# Patient Record
Sex: Male | Born: 1965 | Race: Black or African American | Hispanic: No | Marital: Married | Smoking: Never smoker
Health system: Southern US, Community
[De-identification: ages and names within clinical notes are randomized; demographics above are authoritative.]

---

## 2018-02-24 ENCOUNTER — Emergency Department (HOSPITAL_BASED_OUTPATIENT_CLINIC_OR_DEPARTMENT_OTHER): Payer: BLUE CROSS/BLUE SHIELD

## 2018-02-24 ENCOUNTER — Emergency Department (HOSPITAL_BASED_OUTPATIENT_CLINIC_OR_DEPARTMENT_OTHER)
Admission: EM | Admit: 2018-02-24 | Discharge: 2018-02-25 | Disposition: A | Discharge status: Home / Self Care | Payer: BLUE CROSS/BLUE SHIELD | Attending: Emergency Medicine | Admitting: Emergency Medicine

## 2018-02-24 ENCOUNTER — Other Ambulatory Visit: Payer: Self-pay

## 2018-02-24 ENCOUNTER — Encounter (HOSPITAL_BASED_OUTPATIENT_CLINIC_OR_DEPARTMENT_OTHER): Payer: Self-pay | Admitting: Emergency Medicine

## 2018-02-24 DIAGNOSIS — R1010 Upper abdominal pain, unspecified: Secondary | ICD-10-CM | POA: Diagnosis present

## 2018-02-24 DIAGNOSIS — R1013 Epigastric pain: Secondary | ICD-10-CM | POA: Diagnosis not present

## 2018-02-24 DIAGNOSIS — R6510 Systemic inflammatory response syndrome (SIRS) of non-infectious origin without acute organ dysfunction: Secondary | ICD-10-CM

## 2018-02-24 LAB — URINALYSIS, ROUTINE W REFLEX MICROSCOPIC
Bilirubin Urine: NEGATIVE
GLUCOSE, UA: NEGATIVE mg/dL
KETONES UR: NEGATIVE mg/dL
LEUKOCYTES UA: NEGATIVE
NITRITE: NEGATIVE
PROTEIN: NEGATIVE mg/dL
Specific Gravity, Urine: >1.03 — ABNORMAL HIGH (ref 1.005–1.030)
pH: 5.5 (ref 5.0–8.0)

## 2018-02-24 LAB — I-STAT CG4 LACTIC ACID, ED
LACTIC ACID, VENOUS: 1.7 mmol/L (ref 0.5–1.9)
Lactic Acid, Venous: 2.25 mmol/L (ref 0.5–1.9)

## 2018-02-24 LAB — COMPREHENSIVE METABOLIC PANEL
ALK PHOS: 47 U/L (ref 38–126)
ALT: 49 U/L — ABNORMAL HIGH (ref 0–44)
ANION GAP: 10 (ref 5–15)
AST: 37 U/L (ref 15–41)
Albumin: 3.8 g/dL (ref 3.5–5.0)
BUN: 15 mg/dL (ref 6–20)
CALCIUM: 8.3 mg/dL — AB (ref 8.9–10.3)
CO2: 23 mmol/L (ref 22–32)
CREATININE: 1.46 mg/dL — AB (ref 0.61–1.24)
Chloride: 107 mmol/L (ref 98–111)
GFR calc Af Amer: >60 mL/min (ref >60)
GFR, EST NON AFRICAN AMERICAN: 53 mL/min — AB (ref >60)
Glucose, Bld: 133 mg/dL — ABNORMAL HIGH (ref 70–99)
Potassium: 3.3 mmol/L — ABNORMAL LOW (ref 3.5–5.1)
Sodium: 140 mmol/L (ref 135–145)
Total Bilirubin: 0.8 mg/dL (ref 0.3–1.2)
Total Protein: 6.3 g/dL — ABNORMAL LOW (ref 6.5–8.1)

## 2018-02-24 LAB — CBC WITH DIFFERENTIAL/PLATELET
BASOS ABS: 0 10*3/uL (ref 0.0–0.1)
Basophils Relative: 0 %
EOS ABS: 0 10*3/uL (ref 0.0–0.7)
EOS PCT: 0 %
HCT: 47.2 % (ref 39.0–52.0)
Hemoglobin: 16.7 g/dL (ref 13.0–17.0)
Lymphocytes Relative: 4 %
Lymphs Abs: 0.8 10*3/uL (ref 0.7–4.0)
MCH: 29.2 pg (ref 26.0–34.0)
MCHC: 35.4 g/dL (ref 30.0–36.0)
MCV: 82.5 fL (ref 78.0–100.0)
MONO ABS: 0.5 10*3/uL (ref 0.1–1.0)
Monocytes Relative: 3 %
Neutro Abs: 16.8 10*3/uL — ABNORMAL HIGH (ref 1.7–7.7)
Neutrophils Relative %: 93 %
PLATELETS: 204 10*3/uL (ref 150–400)
RBC: 5.72 MIL/uL (ref 4.22–5.81)
RDW: 15.3 % (ref 11.5–15.5)
WBC: 18.1 10*3/uL — ABNORMAL HIGH (ref 4.0–10.5)

## 2018-02-24 LAB — URINALYSIS, MICROSCOPIC (REFLEX)

## 2018-02-24 LAB — CK: Total CK: 171 U/L (ref 49–397)

## 2018-02-24 LAB — LIPASE, BLOOD: LIPASE: 30 U/L (ref 11–51)

## 2018-02-24 LAB — MONONUCLEOSIS SCREEN: Mono Screen: NEGATIVE

## 2018-02-24 LAB — GROUP A STREP BY PCR: GROUP A STREP BY PCR: NOT DETECTED

## 2018-02-24 MED ORDER — IOPAMIDOL (ISOVUE-300) INJECTION 61%
100.0000 mL | Freq: Once | INTRAVENOUS | Status: AC | PRN
  Administered 2018-02-24: 100 mL via INTRAVENOUS

## 2018-02-24 MED ORDER — SODIUM CHLORIDE 0.9 % IV BOLUS
1000.0000 mL | Freq: Once | INTRAVENOUS | Status: AC
  Administered 2018-02-24: 1000 mL via INTRAVENOUS

## 2018-02-24 MED ORDER — SODIUM CHLORIDE 0.9 % IV SOLN
INTRAVENOUS | Status: DC | PRN
Start: 2018-02-24 — End: 2018-02-25
  Administered 2018-02-24: 19:00:00 via INTRAVENOUS

## 2018-02-24 MED ORDER — IOPAMIDOL (ISOVUE-300) INJECTION 61%: 100.0000 mL | Freq: Once | INTRAVENOUS | Status: DC | PRN

## 2018-02-24 MED ORDER — DOXYCYCLINE HYCLATE 100 MG PO CAPS: 100.0000 mg | ORAL_CAPSULE | Freq: Two times a day (BID) | ORAL | 0 refills | Status: AC

## 2018-02-24 MED ORDER — SODIUM CHLORIDE 0.9 % IV SOLN
INTRAVENOUS | Status: DC | PRN
  Administered 2018-02-24: 250 mL via INTRAVENOUS

## 2018-02-24 MED ORDER — ACETAMINOPHEN 325 MG PO TABS
650.0000 mg | ORAL_TABLET | Freq: Once | ORAL | Status: AC
  Administered 2018-02-24: 650 mg via ORAL
  Filled 2018-02-24: qty 2

## 2018-02-24 MED ORDER — SODIUM CHLORIDE 0.9 % IV SOLN
INTRAVENOUS | Status: AC
  Filled 2018-02-24: qty 10

## 2018-02-24 MED ORDER — IBUPROFEN 800 MG PO TABS
800.0000 mg | ORAL_TABLET | Freq: Once | ORAL | Status: AC
Start: 2018-02-24 — End: 2018-02-24
  Administered 2018-02-24: 800 mg via ORAL
  Filled 2018-02-24: qty 1

## 2018-02-24 MED ORDER — PREDNISONE 50 MG PO TABS
60.0000 mg | ORAL_TABLET | Freq: Once | ORAL | Status: AC
  Administered 2018-02-24: 60 mg via ORAL
  Filled 2018-02-24: qty 1

## 2018-02-24 MED ORDER — SODIUM CHLORIDE 0.9 % IV SOLN
2.0000 g | INTRAVENOUS | Status: DC
  Administered 2018-02-24: 2 g via INTRAVENOUS

## 2018-02-24 MED ORDER — DIPHENHYDRAMINE HCL 25 MG PO CAPS
50.0000 mg | ORAL_CAPSULE | Freq: Once | ORAL | Status: AC
  Administered 2018-02-24: 50 mg via ORAL
  Filled 2018-02-24: qty 2

## 2018-02-24 MED ORDER — METRONIDAZOLE IN NACL 5-0.79 MG/ML-% IV SOLN
500.0000 mg | Freq: Three times a day (TID) | INTRAVENOUS | Status: DC
  Administered 2018-02-24: 500 mg via INTRAVENOUS
  Filled 2018-02-24: qty 100

## 2018-02-24 NOTE — ED Notes (Signed)
Pt was getting dressed and his wife noted a rash across his back. This RN assessed pt and noted urticaria diffusely across upper back and to R side of face. Pt denies difficulty breathing. Lung sounds clear & =. EDP notified.

## 2018-02-24 NOTE — ED Triage Notes (Signed)
Patient has had a rash x 1 week - patient states that he went to the urgent care yesterday  - the patient states that today he started having a tightness in his throat and this afternoon he started to have lower abdominal pain after eating some chicken soup tonight

## 2018-02-24 NOTE — ED Notes (Signed)
Noted that the Rocephin was not running in. Connections checked and rocephin rate was reset at this time. Ensured medication was running.

## 2018-02-24 NOTE — Discharge Instructions (Signed)
Take antibiotics as prescribed.  Take the entire course, even if your symptoms improve. Follow-up with your primary care doctor for further evaluation of symptoms. Return to the emergency room if you develop any new, worsening, or concerning symptoms.

## 2018-02-24 NOTE — ED Provider Notes (Signed)
[facility] EMERGENCY DEPARTMENT Provider Note   CSN: [id number] Arrival date & time: 02/24/18  1614     History   Chief Complaint Chief Complaint  Patient presents with  . Abdominal Pain    HPI Tim CapriJohn King is a 52 y.o. male with no past medical history is here for evaluation of upper abdominal pain that radiates to bilateral flanks that began this afternoon.  Initially pain was 8-10/10 but now has improved.  Pain began 20 to 30 minutes after eating chicken noodle soup.  Associated symptoms include sore throat with sensation of mucus buildup in the throat, mild cough.  Noted to be febrile in triage.  He denies any nausea, vomiting, diarrhea, constipation, melena, hematochezia, dysuria, frequency or urgency.  Last BM today was normal.  No abdominal surgeries.  He denies any radiation of the pain into his chest, shortness of breath, lightheadedness.  Of note, patient states he has been having itchy, intermittent rash to his arms and legs for the last week.  He developed mild lip swelling and went to [facility] yesterday where he was told it was likely allergic rash, he was given Decadron injection and discharged with Zyrtec and Benadryl.  The rash has resolved.   HPI  History reviewed. No pertinent past medical history.  There are no active problems to display for this patient.   History reviewed. No pertinent surgical history.      Home Medications    Prior to Admission medications   Not on File    Family History No family history on file.  Social History Social History   Tobacco Use  . Smoking status: Never Smoker  . Smokeless tobacco: Never Used  Substance Use Topics  . Alcohol use: Yes    Comment: occ  . Drug use: Never     Allergies   Patient has no known allergies.   Review of Systems Review of Systems  Constitutional: Positive for fever.  HENT: Positive for sore throat.   Gastrointestinal: Positive for abdominal pain.  Skin: Positive for  rash (resolved).  All other systems reviewed and are negative.    Physical Exam Updated Vital Signs BP 116/79   Pulse (!) 101   Temp 99.5 F (37.5 C) (Oral)   Resp (!) 24   Ht 5\' 8"  (1.727 m)   Wt 88.5 kg   SpO2 98%   BMI 29.65 kg/m   Physical Exam  Constitutional: He is oriented to person, place, and time. He appears well-developed and well-nourished. No distress.  Non toxic.  HENT:  Head: Normocephalic and atraumatic.  Nose: Nose normal.  Mouth/Throat: Posterior oropharyngeal erythema present.  Mild oropharyngeal erythema, normal tonsils and uvula. MMM. No trismus. No SL edema or tenderness, normal protrusion of tongue. No perioral, tongue or oropharyngeal edema. Normal phonation.   Eyes: Pupils are equal, round, and reactive to light. Conjunctivae and EOM are normal.  Neck: Normal range of motion.  Cardiovascular: Normal rate, regular rhythm, normal heart sounds and intact distal pulses.  2+ DP and radial pulses bilaterally. No LE edema.   Pulmonary/Chest: Effort normal and breath sounds normal.  Abdominal: Soft. Bowel sounds are normal. There is tenderness in the epigastric area.  Mild epigastric abd and suprapubic tenderness. No G/R/R. No CVA tenderness. Negative Murphy's and McBurney's. Active BS in lower quadrants.   Musculoskeletal: Normal range of motion.  Neurological: He is alert and oriented to person, place, and time.  Skin: Skin is warm and dry. Capillary refill takes less  than 2 seconds.  Psychiatric: He has a normal mood and affect. His behavior is normal. Judgment and thought content normal.  Nursing note and vitals reviewed.    ED Treatments / Results  Labs (all labs ordered are listed, but only abnormal results are displayed) Labs Reviewed  COMPREHENSIVE METABOLIC PANEL - Abnormal; Notable for the following components:      Result Value   Potassium 3.3 (*)    Glucose, Bld 133 (*)    Creatinine, Ser 1.46 (*)    Calcium 8.3 (*)    Total Protein 6.3  (*)    ALT 49 (*)    GFR calc non Af Amer 53 (*)    All other components within normal limits  CBC WITH DIFFERENTIAL/PLATELET - Abnormal; Notable for the following components:   WBC 18.1 (*)    Neutro Abs 16.8 (*)    All other components within normal limits  URINALYSIS, ROUTINE W REFLEX MICROSCOPIC - Abnormal; Notable for the following components:   Specific Gravity, Urine >1.030 (*)    Hgb urine dipstick TRACE (*)    All other components within normal limits  URINALYSIS, MICROSCOPIC (REFLEX) - Abnormal; Notable for the following components:   Bacteria, UA MANY (*)    All other components within normal limits  I-STAT CG4 LACTIC ACID, ED - Abnormal; Notable for the following components:   Lactic Acid, Venous 2.25 (*)    All other components within normal limits  GROUP A STREP BY PCR  CULTURE, BLOOD (ROUTINE X 2)  CULTURE, BLOOD (ROUTINE X 2)  MONONUCLEOSIS SCREEN  CK  LIPASE, BLOOD  I-STAT CG4 LACTIC ACID, ED    EKG EKG Interpretation  Date/Time:  Saturday February 24 2018 18:38:16 EDT Ventricular Rate:  110 PR Interval:    QRS Duration: 88 QT Interval:  310 QTC Calculation: 420 R Axis:   -23 Text Interpretation:  Sinus tachycardia Probable left atrial enlargement Borderline left axis deviation Low voltage, precordial leads Borderline T abnormalities, diffuse leads Borderline ST elevation, lateral leads No previous ECGs available Confirmed by Richardean CanalYao, David H [phone]([id number]) on 02/24/2018 6:40:40 PM   Radiology Dg Chest 2 View  Result Date: 02/24/2018 CLINICAL DATA:  Allergic reaction with cough and fever EXAM: CHEST - 2 VIEW COMPARISON:  None. FINDINGS: Lungs are clear. Heart size and pulmonary vascularity are normal. No adenopathy. No evident bone lesions. IMPRESSION: No edema or consolidation. Electronically Signed   By: Bretta BangWilliam  Woodruff III M.D.   On: 02/24/2018 18:16   [place] Abdomen Complete  Result Date: 02/24/2018 CLINICAL DATA:  Generalized abdominal pain EXAM: ABDOMEN  ULTRASOUND COMPLETE COMPARISON:  None. FINDINGS: Gallbladder: No gallstones or wall thickening visualized. There is no pericholecystic fluid. No sonographic Murphy sign noted by sonographer. Common bile duct: Diameter: 4 mm. No intrahepatic, common hepatic, or common bile duct dilatation. Liver: No focal lesion identified. Within normal limits in parenchymal echogenicity. Portal vein is patent on color Doppler imaging with normal direction of blood flow towards the liver. IVC: No abnormality visualized. Pancreas: Visualized portion unremarkable. Portions of pancreas obscured by gas. Spleen: Size and appearance within normal limits. Right Kidney: Length: 10.5 cm. Echogenicity within normal limits. No mass or hydronephrosis visualized. Left Kidney: Length: 10.2 cm. Echogenicity within normal limits. No mass or hydronephrosis visualized. Abdominal aorta: No aneurysm visualized. Other findings: No demonstrable ascites. IMPRESSION: Portions of pancreas obscured by gas. Visualized portions of pancreas appear normal. Study otherwise unremarkable. Electronically Signed   By: Bretta BangWilliam  Woodruff III M.D.   On:  02/24/2018 19:49    Procedures Procedures (including critical care time)  Medications Ordered in ED Medications  cefTRIAXone (ROCEPHIN) 2 g in sodium chloride 0.9 % 100 mL IVPB ( Intravenous Rate/Dose Verify 02/24/18 2034)  metroNIDAZOLE (FLAGYL) IVPB 500 mg ( Intravenous Stopped 02/24/18 2029)  sodium chloride 0.9 % with cefTRIAXone (ROCEPHIN) ADS Med (has no administration in time range)  0.9 %  sodium chloride infusion (250 mLs Intravenous New Bag/Given 02/24/18 1926)  0.9 %  sodium chloride infusion ( Intravenous New Bag/Given 02/24/18 1918)  sodium chloride 0.9 % bolus 1,000 mL (1,000 mLs Intravenous New Bag/Given 02/24/18 2022)  ibuprofen (ADVIL,MOTRIN) tablet 800 mg (has no administration in time range)  iopamidol (ISOVUE-300) 61 % injection 100 mL (has no administration in time range)  acetaminophen  (TYLENOL) tablet 650 mg (650 mg Oral Given 02/24/18 1716)  sodium chloride 0.9 % bolus 1,000 mL (0 mLs Intravenous Stopped 02/24/18 1748)     Initial Impression / Assessment and Plan / ED Course  I have reviewed the triage vital signs and the nursing notes.  Pertinent labs & imaging results that were available during my care of the patient were reviewed by me and considered in my medical decision making (see chart for details).  Clinical Course as of Feb 25 2039  Sat Feb 24, 2018  1708 Lactic Acid, Venous(!!): 2.25 [CG]  1802 WBC(!): 18.1 [CG]  1802 Creatinine(!): 1.46 [CG]  1803 Pulse Rate(!): 115 [CG]  1803 Resp(!): 25 [CG]  1958 IMPRESSION: Portions of pancreas obscured by gas. Visualized portions of pancreas appear normal. Study otherwise unremarkable.   Electronically Signed By: Bretta Bang III M.D. On: 02/24/2018 19:49    US Abdomen Complete [CG]    Clinical Course User Index [CG] Liberty Handy, PA-C    52 yo M with fever.  Reports sore throat, throat mucus, upper abd pain.    Initially tachycardic, febrile and tachypnic.  He is well appearing however, and tachycardia possibly from fever, will hold off sepsis code and re-check VS.  Will initiate infectious work up including pharyngitis work up.  Consider viral syndrome vs pancreatitis vs cholecystitis vs PNA vs UTI/pyelonephritis.   1815: persistent fever, tachycardia, tachypnea now with WBC 18 and lactic 2.25. At this time will active code sepsis, give broad spectrum abx for suspected intrabd infection.  He does have mild epigastric tenderness on repeat exam but also reporting sore throat as well. Discussed pt with Dr Silverio Lay, will opt for complete abdominal US. Consider CTAP for persistent abd tenderness, abnormal VS.   2035:  [place] negative.  Abd pain improved. VS normalizing.  Mono screen negative, strep pending.  Will hand pt off to oncoming EDPA who will f/u on strep test and reassess. Consider CTAP to r/o  occult intrabd infection. Disposition per re-evaluation and remaining work up.   Final Clinical Impressions(s) / ED Diagnoses   Final diagnoses:  Epigastric abdominal pain  SIRS (systemic inflammatory response syndrome) [facility])    ED Discharge Orders    None       Jerrell Mylar 02/24/18 2040    Charlynne Pander, MD 02/24/18 [phone]

## 2018-02-24 NOTE — ED Notes (Signed)
Patient transported to Ultrasound 

## 2018-02-25 NOTE — ED Notes (Signed)
Rash remains stable with no signs of worsening. Pt's airway remains clear and intact. Clear & = lung sounds at time of d/c

## 2018-02-25 NOTE — ED Provider Notes (Signed)
  Physical Exam  BP 132/74   Pulse 81   Temp 98.2 F (36.8 C)   Resp 20   Ht 5\' 8"  (1.727 m)   Wt 88.5 kg   SpO2 100%   BMI 29.65 kg/m   Physical Exam   Skin: urticarial rash over the back and neck. No significant pruritis.  CV: RRR Lungs: CTAB. No wheezing. Abd: no TTP. No nausea/vomiting HEENT: no airway compromise. No tongue/lip/throat swelling.   ED Course/Procedures   Clinical Course as of Feb 26 19  Sat Feb 24, 2018  1708 Lactic Acid, Venous(!!): 2.25 [CG]  1802 WBC(!): 18.1 [CG]  1802 Creatinine(!): 1.46 [CG]  1803 Pulse Rate(!): 115 [CG]  1803 Resp(!): 25 [CG]  1958 IMPRESSION: Portions of pancreas obscured by gas. Visualized portions of pancreas appear normal. Study otherwise unremarkable.   Electronically Signed By: Bretta BangWilliam Woodruff III M.D. On: 02/24/2018 19:49    US Abdomen Complete [CG]    Clinical Course User Index [CG] Liberty HandyGibbons, Claudia J, PA-C    Procedures  MDM   Pt signed out to me by Janene Madeira Gibbons, PA-C. Please see previous notes for further history.   In brief, pt presenting for evaluation of epigastric abd pain, which resolved prior to evaluation in the ER. However, pt was found to be tachycardic, febrile, and tachypneic. Concern for SIRS response. Wbc and lactic elevated. Vitals initially did not improve with tx, code sepsis called. Initial imaging reassuring, no obvious source. Pt's vitals improved, and abd exam remained benign. CT abd pending, strep pending.   Strep negative. CT abd shows reactive lymph nodes but otherwise negative for infection/surgical abd. On reassessment, pt state he feels good, and is ready for discharge. Discussed with attending, Dr. Silverio LayYao agrees to plan.   On discharge, informed by RN that pt has a rash over his back and neck. On evaluation, pt has urticarial rash without signs of airway compromise or distress. No tounge/lip/throat swelling. No cp/sob. No nausea/vomiting. doubt anaphylaxis from medication, ?rash  due to viral/infectious process. Pt with recent rash for which he was Rx'd prednisone and antihistamine. Will give prednisone and benadryl and reassess.   On reassement, pt remains stable. No improvement of rash, but no spreading/airway compromise. Pt monitored for an hour with no further spreading. Case discussed with attending, Dr. Silverio LayYao agrees to plan. Will have pt continue rx from yesterday for prednisone and antihistamine. Will d/c on doxycycline for possible intra-abd vs GU infection. Pt given strict return precautions. At this time, pt appears safe for d/c. Return precautions given. Pt states he understands and agrees to plan.       Alveria ApleyCaccavale, Marque Rademaker, PA-C 02/25/18 0020    Charlynne PanderYao, David Hsienta, MD 03/01/18 [phone]

## 2018-03-02 LAB — CULTURE, BLOOD (ROUTINE X 2)
Culture: NO GROWTH
Culture: NO GROWTH
Special Requests: ADEQUATE
Special Requests: ADEQUATE

## 2019-12-22 IMAGING — CT CT ABD-PELV W/ CM
2 of 5 series · 16 of 46 positions shown, 18 images · IV contrast (APPLIED)
Comparison: Ultrasound abdomen 02/24/2018

CLINICAL DATA: Abdominal pain for 1 day. Allergic reaction and sore
throat earlier this week.

EXAM:
CT ABDOMEN AND PELVIS WITH CONTRAST
TECHNIQUE: Multidetector CT imaging of the abdomen and pelvis was performed
using the standard protocol following bolus administration of
intravenous contrast.
CONTRAST:  100mL II9BI9-000 IOPAMIDOL (II9BI9-000) INJECTION 61%

[Series 2: axial st · axial · 0.86mm/px · z∈[+487,+887]mm · 13 of 90 slices shown, 15 images]
[im 5/90  soft-tissue]
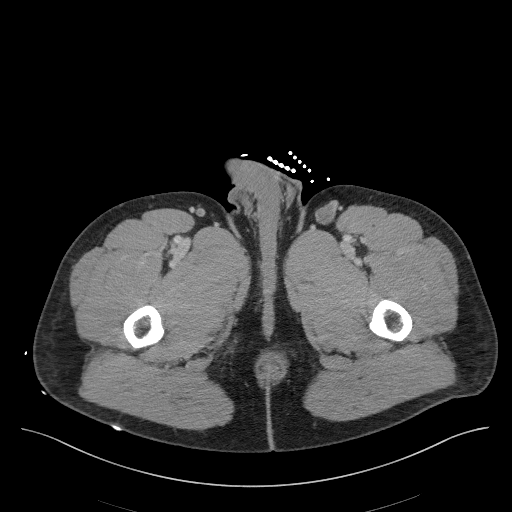
[im 5/90  bone]
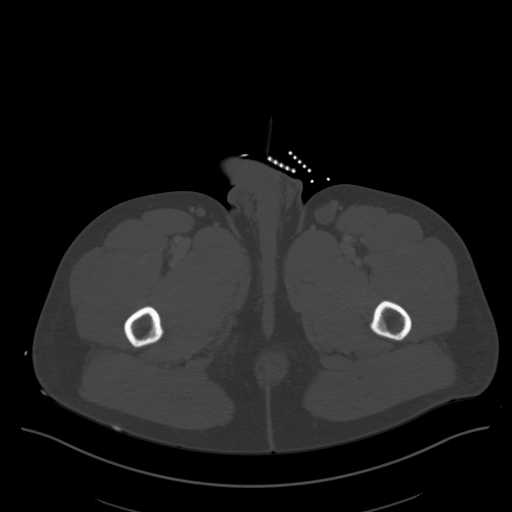
[im 10/90  soft-tissue]
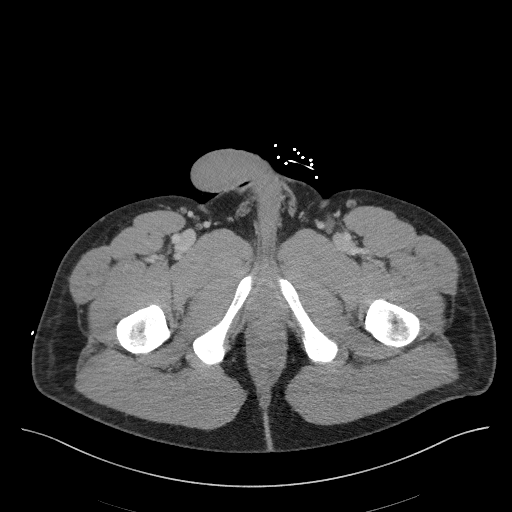
[im 20/90  soft-tissue]
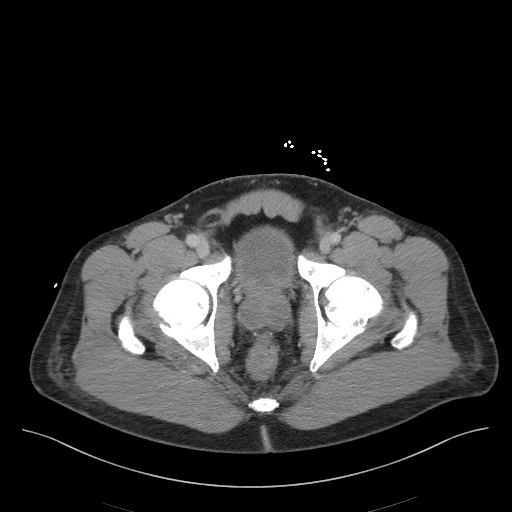
[im 25/90  soft-tissue]
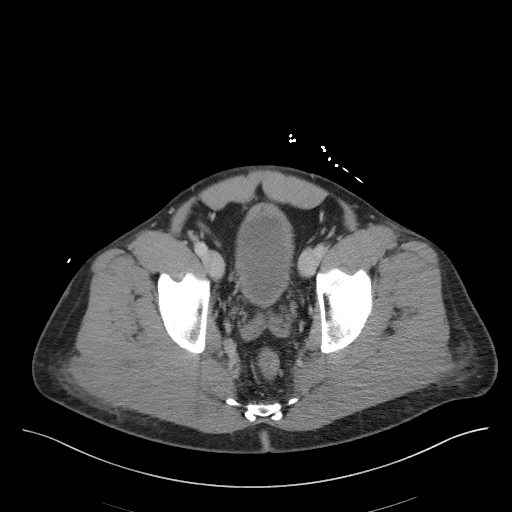
[im 30/90  soft-tissue]
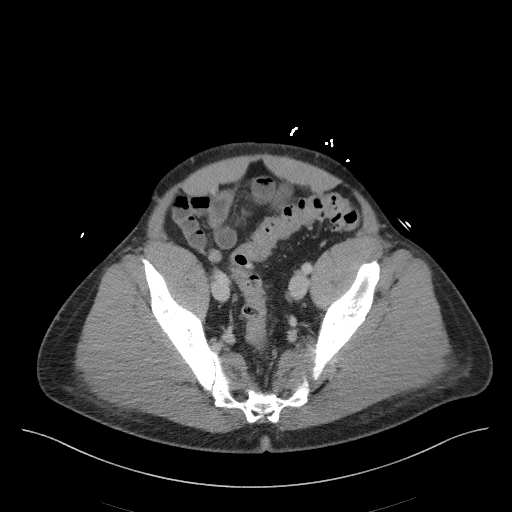
[im 40/90  soft-tissue]
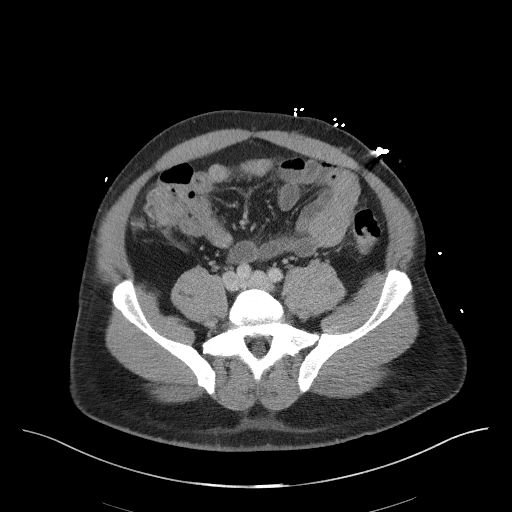
[im 45/90  soft-tissue]
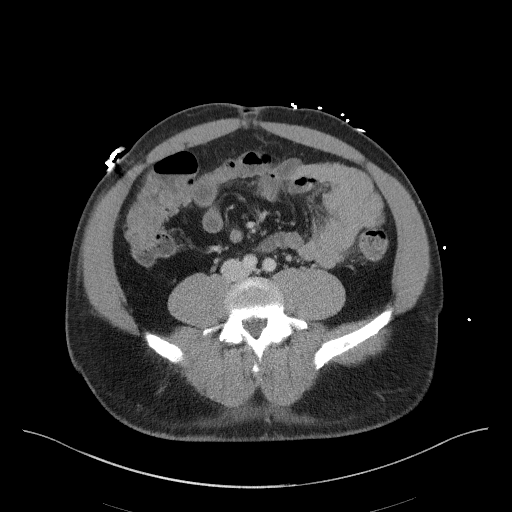
[im 50/90  soft-tissue]
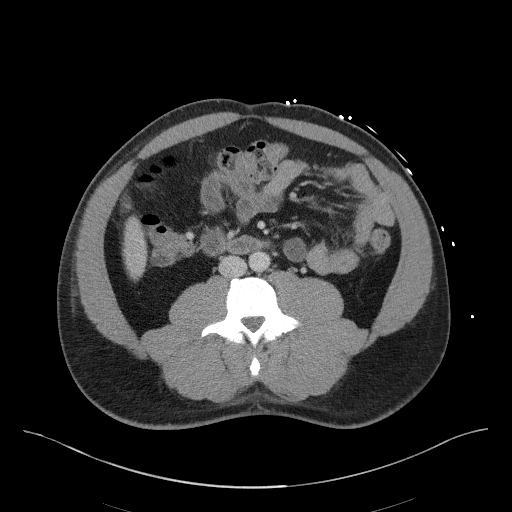
[im 60/90  soft-tissue]
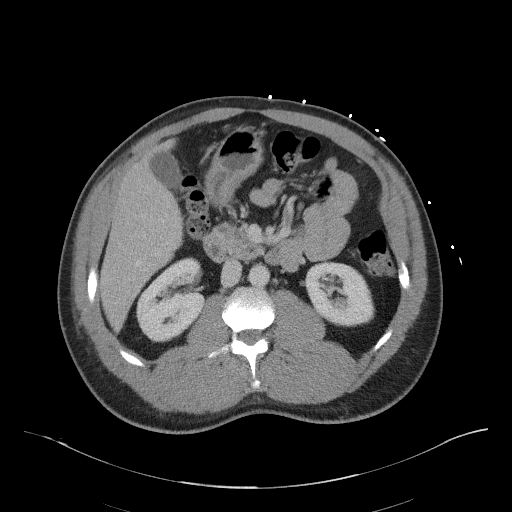
[im 60/90  bone]
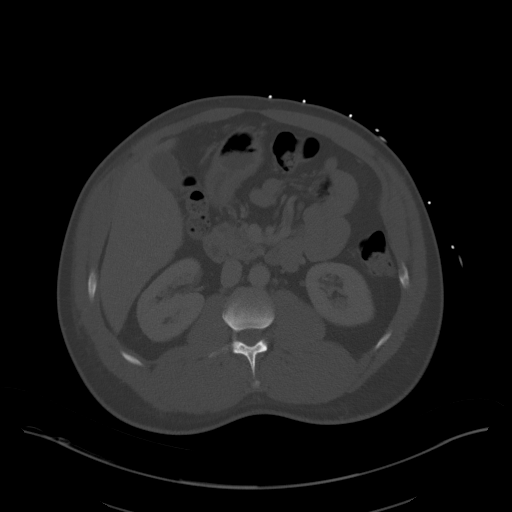
[im 65/90  soft-tissue]
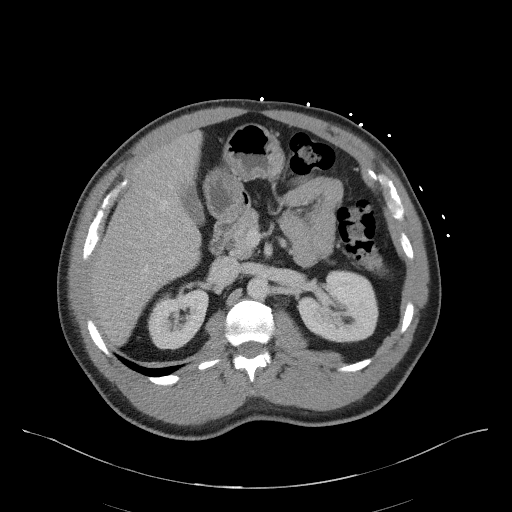
[im 70/90  soft-tissue]
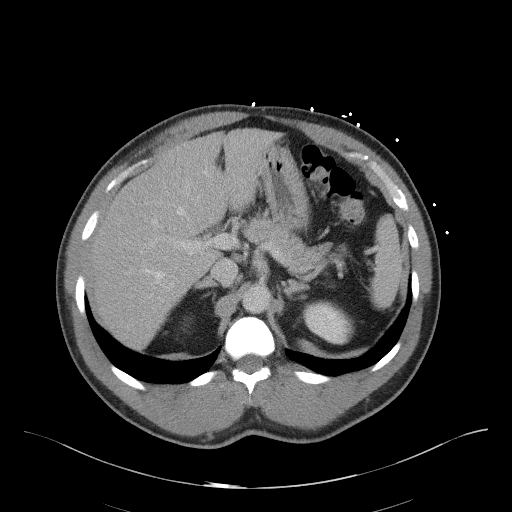
[im 80/90  soft-tissue]
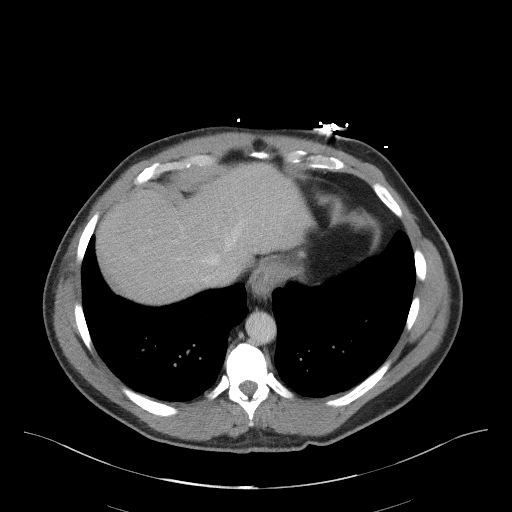
[im 85/90  soft-tissue]
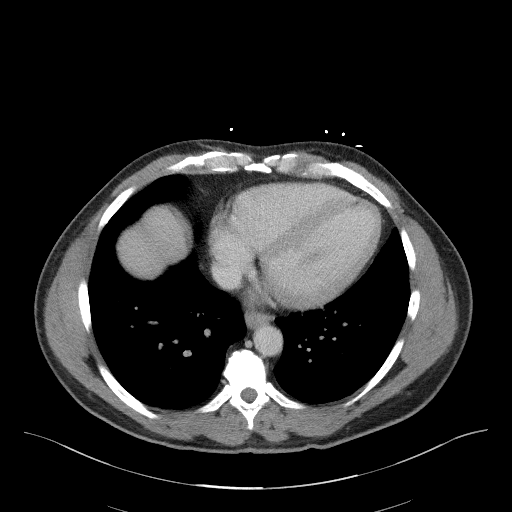

[Series 5: coronal st · coronal · 0.70mm/px · 3 of 99 slices shown]
[im 33/99  soft-tissue]
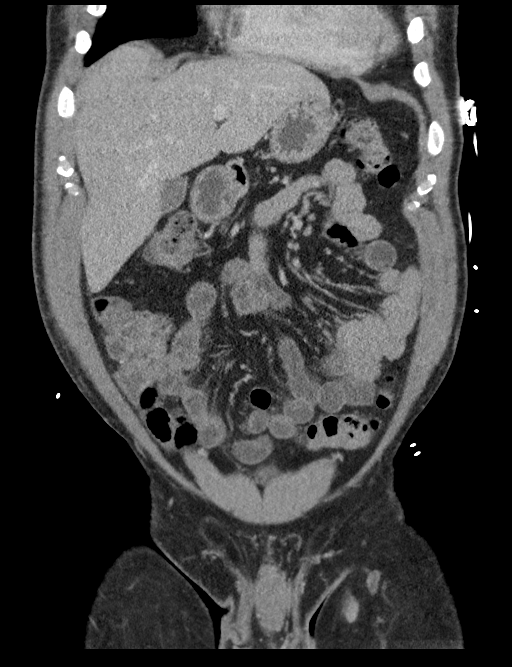
[im 44/99  soft-tissue]
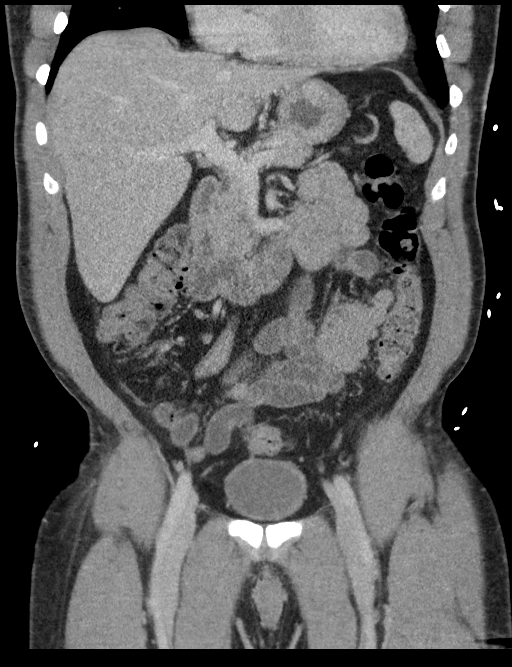
[im 55/99  soft-tissue]
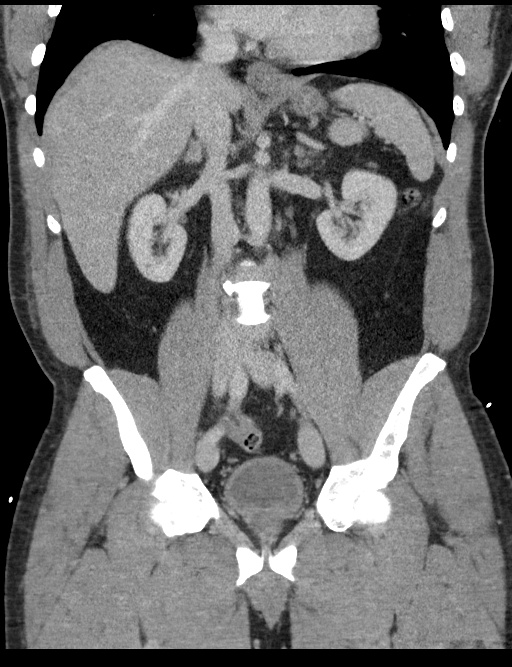

[16 of 46 positions shown; findings below may reference images not displayed]

FINDINGS: Lower chest: Mild dependent changes in the lung bases. Small left
Bochdalek's type hiatal hernia containing fat.

Hepatobiliary: No focal liver abnormality is seen. No gallstones,
gallbladder wall thickening, or biliary dilatation.

Pancreas: Unremarkable. No pancreatic ductal dilatation or
surrounding inflammatory changes.

Spleen: Normal in size without focal abnormality.

Adrenals/Urinary Tract: Adrenal glands are unremarkable. Kidneys are
normal, without renal calculi, focal lesion, or hydronephrosis.
Bladder is unremarkable.

Stomach/Bowel: Stomach, small bowel, and colon are not abnormally
distended. Moderate stool throughout the colon. Diverticulosis of
the sigmoid colon without evidence of diverticulitis. No bowel wall
thickening or inflammatory infiltration although under distention
limits evaluation. Appendix is normal.

Vascular/Lymphatic: No significant vascular findings are present. No
enlarged abdominal or pelvic lymph nodes. Enlarged lymph node
demonstrated in the left inferior groin region measuring about
cm short axis dimension and containing central low-attenuation. This
likely to represent inflammatory or reactive node.

Reproductive: Prostate is unremarkable. Scrotal varicoceles are
incidentally identified.

Other: No abdominal wall hernia or abnormality. No abdominopelvic
ascites.

Musculoskeletal: No acute or significant osseous findings.
IMPRESSION: 1. Diverticulosis of the sigmoid colon without evidence of
diverticulitis.
2. Enlarged lymph node in the left inferior groin region is likely
inflammatory or reactive.
3. Bilateral scrotal varicoceles are identified.

## 2024-02-29 ENCOUNTER — Other Ambulatory Visit (HOSPITAL_BASED_OUTPATIENT_CLINIC_OR_DEPARTMENT_OTHER): Payer: Self-pay | Admitting: Family Medicine

## 2024-02-29 DIAGNOSIS — Z136 Encounter for screening for cardiovascular disorders: Secondary | ICD-10-CM

## 2024-03-07 ENCOUNTER — Ambulatory Visit (HOSPITAL_BASED_OUTPATIENT_CLINIC_OR_DEPARTMENT_OTHER)
Admission: RE | Admit: 2024-03-07 | Discharge: 2024-03-07 | Disposition: A | Discharge status: Home / Self Care | Payer: Self-pay | Source: Ambulatory Visit | Attending: Family Medicine | Admitting: Family Medicine

## 2024-03-07 DIAGNOSIS — Z136 Encounter for screening for cardiovascular disorders: Secondary | ICD-10-CM | POA: Insufficient documentation
